# Patient Record
Sex: Male | Born: 2000 | Race: White | Hispanic: No | Marital: Single | Smoking: Never smoker
Health system: Southern US, Community
[De-identification: ages and names within clinical notes are randomized; demographics above are authoritative.]

## PROBLEM LIST (undated history)

## (undated) DIAGNOSIS — J45909 Unspecified asthma, uncomplicated: Secondary | ICD-10-CM

## (undated) DIAGNOSIS — F909 Attention-deficit hyperactivity disorder, unspecified type: Secondary | ICD-10-CM

---
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
## 2022-09-17 ENCOUNTER — Emergency Department (HOSPITAL_BASED_OUTPATIENT_CLINIC_OR_DEPARTMENT_OTHER)
Admission: EM | Admit: 2022-09-17 | Discharge: 2022-09-17 | Disposition: A | Discharge status: Home / Self Care | Payer: BC Managed Care – PPO | Attending: Emergency Medicine | Admitting: Emergency Medicine

## 2022-09-17 ENCOUNTER — Emergency Department (HOSPITAL_BASED_OUTPATIENT_CLINIC_OR_DEPARTMENT_OTHER): Payer: BC Managed Care – PPO

## 2022-09-17 ENCOUNTER — Other Ambulatory Visit: Payer: Self-pay

## 2022-09-17 DIAGNOSIS — R079 Chest pain, unspecified: Secondary | ICD-10-CM | POA: Insufficient documentation

## 2022-09-17 DIAGNOSIS — Z5321 Procedure and treatment not carried out due to patient leaving prior to being seen by health care provider: Secondary | ICD-10-CM | POA: Diagnosis not present

## 2022-09-17 LAB — BASIC METABOLIC PANEL
Anion gap: 6 (ref 5–15)
BUN: 18 mg/dL (ref 6–20)
CO2: 24 mmol/L (ref 22–32)
Calcium: 8.6 mg/dL — ABNORMAL LOW (ref 8.9–10.3)
Chloride: 105 mmol/L (ref 98–111)
Creatinine, Ser: 0.86 mg/dL (ref 0.61–1.24)
GFR, Estimated: >60 mL/min (ref >60)
Glucose, Bld: 95 mg/dL (ref 70–99)
Potassium: 3.7 mmol/L (ref 3.5–5.1)
Sodium: 135 mmol/L (ref 135–145)

## 2022-09-17 LAB — CBC
HCT: 41 % (ref 39.0–52.0)
Hemoglobin: 14 g/dL (ref 13.0–17.0)
MCH: 30.4 pg (ref 26.0–34.0)
MCHC: 34.1 g/dL (ref 30.0–36.0)
MCV: 89.1 fL (ref 80.0–100.0)
Platelets: 266 10*3/uL (ref 150–400)
RBC: 4.6 MIL/uL (ref 4.22–5.81)
RDW: 12.4 % (ref 11.5–15.5)
WBC: 7.2 10*3/uL (ref 4.0–10.5)
nRBC: 0 % (ref 0.0–0.2)

## 2022-09-17 LAB — TROPONIN I (HIGH SENSITIVITY): Troponin I (High Sensitivity): <2 ng/L (ref <18)

## 2022-09-17 NOTE — ED Triage Notes (Signed)
Pt arrives with c/o CP that started yesterday. Pt endorses jittery feeling also. Pt denies SOB.

## 2023-04-06 ENCOUNTER — Other Ambulatory Visit: Payer: Self-pay

## 2023-04-06 ENCOUNTER — Encounter (HOSPITAL_BASED_OUTPATIENT_CLINIC_OR_DEPARTMENT_OTHER): Payer: Self-pay

## 2023-04-06 ENCOUNTER — Emergency Department (HOSPITAL_BASED_OUTPATIENT_CLINIC_OR_DEPARTMENT_OTHER)
Admission: EM | Admit: 2023-04-06 | Discharge: 2023-04-06 | Disposition: A | Discharge status: Home / Self Care | Payer: BC Managed Care – PPO | Attending: Emergency Medicine | Admitting: Emergency Medicine

## 2023-04-06 DIAGNOSIS — L0212 Furuncle of neck: Secondary | ICD-10-CM | POA: Insufficient documentation

## 2023-04-06 DIAGNOSIS — L739 Follicular disorder, unspecified: Secondary | ICD-10-CM

## 2023-04-06 MED ORDER — MELOXICAM 15 MG PO TABS: 15.0000 mg | ORAL_TABLET | Freq: Every day | ORAL | 0 refills | Status: AC | PRN

## 2023-04-06 MED ORDER — CEPHALEXIN 250 MG PO CAPS
1000.0000 mg | ORAL_CAPSULE | Freq: Once | ORAL | Status: AC
  Administered 2023-04-06: 1000 mg via ORAL
  Filled 2023-04-06: qty 4

## 2023-04-06 MED ORDER — METHOCARBAMOL 500 MG PO TABS: 500.0000 mg | ORAL_TABLET | Freq: Three times a day (TID) | ORAL | 0 refills | Status: AC | PRN

## 2023-04-06 MED ORDER — CEPHALEXIN 500 MG PO CAPS: 500.0000 mg | ORAL_CAPSULE | Freq: Four times a day (QID) | ORAL | 0 refills | Status: AC

## 2023-04-06 NOTE — ED Triage Notes (Signed)
Pt states that he woke up on Monday with an abscess to his left posterior neck area at his hairline. Pt states that he thinks it was a spider and/or tick bite, but never saw either.

## 2023-04-06 NOTE — ED Provider Notes (Signed)
EMERGENCY DEPARTMENT AT [facility] Provider Note   CSN: [id number] Arrival date & time: 04/06/23  0007     History  Chief Complaint  Patient presents with   Abscess    Jose Bolton is a 22 y.o. male.  Patient with a bump/swelling to left upper neck just inside the hairline. Drained something earlier today but not otherwise. Also has pain in his left paracervical (around folliculitis) and left trapezius. No pain with neck ROM or rigidity. Tmax was 99.1. no emesis or other systemic symptoms.    Abscess      Home Medications Prior to Admission medications   Medication Sig Start Date End Date Taking? Authorizing Provider  cephALEXin (KEFLEX) 500 MG capsule Take 1 capsule (500 mg total) by mouth 4 (four) times daily. 04/06/23  Yes Dashana Guizar, Barbara Cower, MD  meloxicam (MOBIC) 15 MG tablet Take 1 tablet (15 mg total) by mouth daily as needed for pain. 04/06/23  Yes Judithe Keetch, Barbara Cower, MD  methocarbamol (ROBAXIN) 500 MG tablet Take 1 tablet (500 mg total) by mouth every 8 (eight) hours as needed for muscle spasms. 04/06/23  Yes Daymion Nazaire, Barbara Cower, MD  EPINEPHrine (EPIPEN 2-PAK) 0.3 mg/0.3 mL IJ SOAJ injection Inject 0.3 mLs (0.3 mg total) into the muscle as needed for anaphylaxis. 02/27/20   Carroll Sage, PA-C  ibuprofen (ADVIL,MOTRIN) 100 MG/5ML suspension Take 5 mg/kg by mouth every 6 (six) hours as needed for fever.    [provider]      Allergies    Penicillins and Penicillins    Review of Systems   Review of Systems  Physical Exam Updated Vital Signs BP 124/88   Pulse 89   Temp 99 F (37.2 C) (Oral)   Resp 18   Ht 6\' 1"  (1.854 m)   Wt 90.7 kg   SpO2 100%   BMI 26.39 kg/m  Physical Exam Vitals and nursing note reviewed.  Constitutional:      Appearance: He is well-developed.  HENT:     Head: Normocephalic and atraumatic.  Cardiovascular:     Rate and Rhythm: Normal rate.  Pulmonary:     Effort: Pulmonary effort is normal. No respiratory  distress.  Abdominal:     General: There is no distension.  Musculoskeletal:        General: Normal range of motion.     Cervical back: Normal range of motion.  Skin:    Comments: Approximately 2 cm area of induration, erythema and warmth in left lower hairline. Ttp of musculature around it.   Neurological:     General: No focal deficit present.     Mental Status: He is alert.     ED Results / Procedures / Treatments   Labs (all labs ordered are listed, but only abnormal results are displayed) Labs Reviewed - No data to display  EKG None  Radiology No results found.  Procedures Procedures    Medications Ordered in ED Medications  cephALEXin (KEFLEX) capsule 1,000 mg (1,000 mg Oral Given 04/06/23 0115)    ED Course/ Medical Decision Making/ A&P                                 Medical Decision Making Risk Prescription drug management.   Low suspicion for meningitis, diffuse infection or systemic infection without s/s same. Will treat for folliculitis/cellulitis and short course of muscle relaxers for same. Pcp follow up if not improving. Here if  things worsen.   Final Clinical Impression(s) / ED Diagnoses Final diagnoses:  Folliculitis    Rx / DC Orders ED Discharge Orders          Ordered    cephALEXin (KEFLEX) 500 MG capsule  4 times daily        04/06/23 0109    methocarbamol (ROBAXIN) 500 MG tablet  Every 8 hours PRN        04/06/23 0109    meloxicam (MOBIC) 15 MG tablet  Daily PRN        04/06/23 0109              Conway Fedora, Barbara Cower, MD 04/06/23 0205

## 2023-04-06 NOTE — ED Notes (Signed)
Discharge instructions reviewed with patient. Patient questions answered and opportunity for education reviewed. Patient voices understanding of discharge instructions with no further questions. Patient ambulatory with steady gait to lobby.  

## 2024-07-28 ENCOUNTER — Other Ambulatory Visit: Payer: Self-pay

## 2024-07-28 ENCOUNTER — Emergency Department
Admission: EM | Admit: 2024-07-28 | Discharge: 2024-07-28 | Disposition: A | Discharge status: Home / Self Care | Attending: Emergency Medicine | Admitting: Emergency Medicine

## 2024-07-28 ENCOUNTER — Encounter: Payer: Self-pay | Admitting: Emergency Medicine

## 2024-07-28 DIAGNOSIS — J351 Hypertrophy of tonsils: Secondary | ICD-10-CM | POA: Diagnosis not present

## 2024-07-28 DIAGNOSIS — J029 Acute pharyngitis, unspecified: Secondary | ICD-10-CM | POA: Diagnosis present

## 2024-07-28 LAB — RESP PANEL BY RT-PCR (RSV, FLU A&B, COVID)  RVPGX2
Influenza A by PCR: NEGATIVE
Influenza B by PCR: NEGATIVE
Resp Syncytial Virus by PCR: NEGATIVE
SARS Coronavirus 2 by RT PCR: NEGATIVE

## 2024-07-28 LAB — GROUP A STREP BY PCR: Group A Strep by PCR: NOT DETECTED

## 2024-07-28 LAB — MONONUCLEOSIS SCREEN: Mono Screen: NEGATIVE

## 2024-07-28 MED ORDER — DEXAMETHASONE SOD PHOSPHATE PF 10 MG/ML IJ SOLN
10.0000 mg | Freq: Once | INTRAMUSCULAR | Status: AC
  Administered 2024-07-28: 10 mg via INTRAMUSCULAR

## 2024-07-28 MED ORDER — KETOROLAC TROMETHAMINE 15 MG/ML IJ SOLN
15.0000 mg | Freq: Once | INTRAMUSCULAR | Status: AC
  Administered 2024-07-28: 15 mg via INTRAMUSCULAR
  Filled 2024-07-28: qty 1

## 2024-07-28 NOTE — Discharge Instructions (Signed)
 Your COVID/flu/RSV, strep, and monotest are negative.  Please follow-up with ENT if your symptoms persist.  Please return for any new, worsening, or changing symptoms or other concerns.  It was a pleasure caring for you today.

## 2024-07-28 NOTE — ED Triage Notes (Signed)
 Patient in via POV, reports recently being treated for Strep, completed full round of antibiotics but continues w/ swollen tonsils.  Denies any pain.  Just wants to make sure Strep is resolved.  Ambulatory to triage; NAD noted a this time.

## 2024-07-28 NOTE — ED Provider Notes (Signed)
 "  [facility] Provider Note    Event Date/Time   First MD Initiated Contact with Patient 07/28/24 1223     (approximate)   History   Sore Throat (/)   HPI  Jose Bolton is a 23 y.o. male who presents today for evaluation of swollen tonsils.  Patient reports that he had strep throat 2 to 3 weeks ago and has completed 2 courses of antibiotics.  He reports that his symptoms improved, and he no longer has a sore throat, but feels that his tonsils are still swollen.  No voice change.  No drooling.  No odynophagia or dysphagia.  No fevers.  There are no active problems to display for this patient.         Physical Exam   Triage Vital Signs: ED Triage Vitals  Encounter Vitals Group     BP 07/28/24 1211 139/76     Girls Systolic BP Percentile --      Girls Diastolic BP Percentile --      Boys Systolic BP Percentile --      Boys Diastolic BP Percentile --      Pulse Rate 07/28/24 1211 70     Resp 07/28/24 1211 19     Temp 07/28/24 1211 98.1 F (36.7 C)     Temp Source 07/28/24 1211 Oral     SpO2 07/28/24 1211 99 %     Weight 07/28/24 1212 220 lb (99.8 kg)     Height 07/28/24 1212 6' 1 (1.854 m)     Head Circumference --      Peak Flow --      Pain Score 07/28/24 1216 0     Pain Loc --      Pain Education --      Exclude from Growth Chart --     Most recent vital signs: Vitals:   07/28/24 1211  BP: 139/76  Pulse: 70  Resp: 19  Temp: 98.1 F (36.7 C)  SpO2: 99%    Physical Exam Vitals and nursing note reviewed.  Constitutional:      General: Awake and alert. No acute distress.    Appearance: Normal appearance. The patient is normal weight.  HENT:     Head: Normocephalic and atraumatic.     Mouth: Mucous membranes are moist. Uvula midline.  No tonsillar exudate.  Cryptic tonsils bilaterally.  No soft palate fluctuance.  No trismus.  No voice change.  No sublingual swelling.  No tender cervical lymphadenopathy.  No nuchal rigidity.  No  drooling Eyes:     General: PERRL. Normal EOMs        Right eye: No discharge.        Left eye: No discharge.     Conjunctiva/sclera: Conjunctivae normal.  Cardiovascular:     Rate and Rhythm: Normal rate and regular rhythm.     Pulses: Normal pulses.  Pulmonary:     Effort: Pulmonary effort is normal. No respiratory distress.     Breath sounds: Normal breath sounds.  Abdominal:     Abdomen is soft. There is no abdominal tenderness. No rebound or guarding. No distention. Musculoskeletal:        General: No swelling. Normal range of motion.     Cervical back: Normal range of motion and neck supple.  Skin:    General: Skin is warm and dry.     Capillary Refill: Capillary refill takes less than 2 seconds.     Findings: No rash.  Neurological:  Mental Status: The patient is awake and alert.      ED Results / Procedures / Treatments   Labs (all labs ordered are listed, but only abnormal results are displayed) Labs Reviewed  RESP PANEL BY RT-PCR (RSV, FLU A&B, COVID)  RVPGX2  GROUP A STREP BY PCR  MONONUCLEOSIS SCREEN     EKG     RADIOLOGY     PROCEDURES:  Critical Care performed:   Procedures   MEDICATIONS ORDERED IN ED: Medications  ketorolac  (TORADOL ) 15 MG/ML injection 15 mg (15 mg Intramuscular Given 07/28/24 1316)  dexamethasone  (DECADRON ) injection 10 mg (10 mg Intramuscular Given 07/28/24 1317)     IMPRESSION / MDM / ASSESSMENT AND PLAN / ED COURSE  I reviewed the triage vital signs and the nursing notes.   Differential diagnosis includes, but is not limited to, tonsillar hypertrophy, strep pharyngitis, viral URI.  Patient is awake and alert, hemodynamically stable and afebrile.  She is nontoxic in appearance.  Tonsils are enlarged bilaterally, no tonsillar exudate.  Uvula is midline, no voice change, trismus, drooling, or neck pain, not consistent with peritonsillar or retropharyngeal abscess.  Repeat swabs obtained in triage are negative for  strep or COVID/flu/RSV.  He agreed to mononucleosis testing.  He was treated symptomatically with Toradol  and Decadron  with good effect.  Swabs and mono are negative.  Recommended follow-up with ENT if symptoms persist.  We also discussed return precautions.  Patient understands and agrees with plan.  Discharged in stable condition.   Patient's presentation is most consistent with acute complicated illness / injury requiring diagnostic workup.     FINAL CLINICAL IMPRESSION(S) / ED DIAGNOSES   Final diagnoses:  Tonsillar hypertrophy     Rx / DC Orders   ED Discharge Orders     None        Note:  This document was prepared using Dragon voice recognition software and may include unintentional dictation errors.   Yocelyn Brocious E, PA-C 07/28/24 1620  "

## 2024-08-10 ENCOUNTER — Other Ambulatory Visit: Payer: Self-pay

## 2024-08-10 ENCOUNTER — Emergency Department
Admission: EM | Admit: 2024-08-10 | Discharge: 2024-08-10 | Disposition: A | Discharge status: Home / Self Care | Attending: Emergency Medicine | Admitting: Emergency Medicine

## 2024-08-10 ENCOUNTER — Encounter: Payer: Self-pay | Admitting: Emergency Medicine

## 2024-08-10 DIAGNOSIS — J029 Acute pharyngitis, unspecified: Secondary | ICD-10-CM | POA: Diagnosis present

## 2024-08-10 DIAGNOSIS — J351 Hypertrophy of tonsils: Secondary | ICD-10-CM | POA: Diagnosis not present

## 2024-08-10 MED ORDER — NAPROXEN 500 MG PO TABS
500.0000 mg | ORAL_TABLET | Freq: Two times a day (BID) | ORAL | Status: AC
  Administered 2024-08-10: 500 mg via ORAL
  Filled 2024-08-10 (×2): qty 1

## 2024-08-10 MED ORDER — NAPROXEN 500 MG PO TABS: 500.0000 mg | ORAL_TABLET | Freq: Two times a day (BID) | ORAL | Status: DC

## 2024-08-10 MED ORDER — PREDNISONE 20 MG PO TABS
60.0000 mg | ORAL_TABLET | Freq: Once | ORAL | Status: AC
  Administered 2024-08-10: 60 mg via ORAL
  Filled 2024-08-10: qty 3

## 2024-08-10 MED ORDER — PREDNISONE 50 MG PO TABS: 50.0000 mg | ORAL_TABLET | Freq: Every day | ORAL | 0 refills | Status: AC

## 2024-08-10 NOTE — ED Triage Notes (Signed)
 Pt reports sore throat and swollen tonsils for a month. Pt has been tested for strep 3 times and mono. Pt to see ENT at end of month but swelling and pain are too much.

## 2024-08-10 NOTE — ED Provider Notes (Signed)
 "  [facility] Provider Note    Event Date/Time   First MD Initiated Contact with Patient 08/10/24 2003     (approximate)   History   Sore Throat    HPI  Jose Bolton is a 24 y.o. male    with a past medical history of tonsillar hypertrophy, folliculitis, bee sting, left leg pain,  who presents to the ED complaining of swollen tonsils for a month. According to the patient, symptoms started during treatment today.  Patient was seen here and they diagnosed tonsillar hypertrophy.  Patient had already 2 rounds of antibiotics.  First diagnosis was strep A.  Patient endorses having difficulty breathing on the nights.  Patient denies fever, chills, drooling, trismus.  Patient is here by himself.    There are no active problems to display for this patient.    Physical Exam   Triage Vital Signs: ED Triage Vitals  Encounter Vitals Group     BP 08/10/24 1823 (!) 142/90     Girls Systolic BP Percentile --      Girls Diastolic BP Percentile --      Boys Systolic BP Percentile --      Boys Diastolic BP Percentile --      Pulse Rate 08/10/24 1823 79     Resp 08/10/24 1823 18     Temp 08/10/24 1822 98.6 F (37 C)     Temp Source 08/10/24 1822 Oral     SpO2 08/10/24 1823 100 %     Weight 08/10/24 1823 219 lb 12.8 oz (99.7 kg)     Height 08/10/24 1823 6' 1 (1.854 m)     Head Circumference --      Peak Flow --      Pain Score 08/10/24 1823 8     Pain Loc --      Pain Education --      Exclude from Growth Chart --     Most recent vital signs: Vitals:   08/10/24 1822 08/10/24 1823  BP:  (!) 142/90  Pulse:  79  Resp:  18  Temp: 98.6 F (37 C)   SpO2:  100%     Physical Exam Vitals and nursing note reviewed.  In triage patient was hypertensive, afebrile, nontachycardic.  General:          Awake, no distress.  Throat: Tonsillar enlargement, tonsils are touching the uvula.  Uvula is in the midline.  No exudates, no plaques, no draining. CV:                   Good peripheral perfusion. Regular rate and rhythm. Resp:               Normal effort. no tachypnea.Equal breath sounds bilaterally.  Abd:                 No distention.  Soft nontender Other:               ED Results / Procedures / Treatments   Labs (all labs ordered are listed, but only abnormal results are displayed) Labs Reviewed - No data to display      RADIOLOGY I independently reviewed and interpreted imaging and agree with radiologists findings.      PROCEDURES:  Critical Care performed:   Procedures   MEDICATIONS ORDERED IN ED: Medications  predniSONE  (DELTASONE ) tablet 60 mg (has no administration in time range)  naproxen  (NAPROSYN ) tablet 500 mg (has no administration in time range)  IMPRESSION / MDM / ASSESSMENT AND PLAN / ED COURSE  I reviewed the triage vital signs and the nursing notes.  Differential diagnosis includes, but is not limited to, tonsillar enlargement, strep A, tonsillar abscess, pharyngitis.  Patient's presentation is most consistent with acute, uncomplicated illness.   Jose Bolton is a 24 y.o., male presents today with history of 10 days of sore throat and tonsillar enlargement.  See HPI for the information.  On a physical exam vital signs were normal.  No signs of difficulty breathing, no drooling, no trismus.  Throat presence of tonsillar enlargement.  Tonsils touching the uvula.  Uvula is midline.  No exudates, no petechia, no drainage.  Rest of physical exam is normal. Plan Prednisone  Naproxen  Referral to ENT Patient's diagnosis is consistent with tonsillar enlargement. I did not order any imaging or labs, physical exam was reassuring.  I did review the patient's allergies and medications.The patient is in stable and satisfactory condition for discharge home  Patient will be discharged home with prescriptions for prednisone .  I did advise patient to keep taking ibuprofen  600 mg every 8 hours.  Patient is to follow up with  ENT tomorrow as needed or otherwise directed. Patient is given ED precautions to return to the ED for any worsening or new symptoms.  Work note will be provided Discussed plan of care with patient, answered all of patient's questions, patient agreeable to plan of care. Advised patient to take medications according to the instructions on the label. Discussed possible side effects of new medications. Patient verbalized understanding.  FINAL CLINICAL IMPRESSION(S) / ED DIAGNOSES   Final diagnoses:  Tonsillar enlargement     Rx / DC Orders   ED Discharge Orders          Ordered    predniSONE  (DELTASONE ) 50 MG tablet  Daily with breakfast        08/10/24 2045             Note:  This document was prepared using Dragon voice recognition software and may include unintentional dictation errors.   Janit Kast, PA-C 08/10/24 2047    Bradler, Evan K, MD 08/10/24 2318  "

## 2024-08-10 NOTE — Discharge Instructions (Addendum)
 You have been diagnosed with tonsillar enlargement.  Please take prednisone  1 tablet by mouth with breakfast for the next 3 days.  Please call tomorrow to ENT be seen tomorrow.  Please take Profen 600 mg every 8 hours.  Please drink plenty of fluids.  Please come back to ED or go to your PCP if you have new symptoms symptoms worsen.

## 2024-08-31 ENCOUNTER — Other Ambulatory Visit: Payer: Self-pay | Admitting: Otolaryngology

## 2024-09-22 ENCOUNTER — Ambulatory Visit: Admit: 2024-09-22 | Admitting: Otolaryngology
